# Patient Record
Sex: Female | Born: 2005 | Race: White | Hispanic: No | Marital: Single | Smoking: Never smoker
Health system: Southern US, Community
[De-identification: ages and names within clinical notes are randomized; demographics above are authoritative.]

## PROBLEM LIST (undated history)

## (undated) DIAGNOSIS — J302 Other seasonal allergic rhinitis: Secondary | ICD-10-CM

## (undated) DIAGNOSIS — I951 Orthostatic hypotension: Secondary | ICD-10-CM

## (undated) HISTORY — DX: Orthostatic hypotension: I95.1

## (undated) HISTORY — PX: NO PAST SURGERIES: SHX2092

---
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
## 2023-01-24 ENCOUNTER — Ambulatory Visit (INDEPENDENT_AMBULATORY_CARE_PROVIDER_SITE_OTHER): Payer: Medicaid Other | Admitting: Pediatric Endocrinology

## 2023-01-24 DIAGNOSIS — R5383 Other fatigue: Secondary | ICD-10-CM | POA: Diagnosis not present

## 2023-01-24 DIAGNOSIS — R7989 Other specified abnormal findings of blood chemistry: Secondary | ICD-10-CM | POA: Diagnosis not present

## 2023-01-24 DIAGNOSIS — E250 Congenital adrenogenital disorders associated with enzyme deficiency: Secondary | ICD-10-CM | POA: Diagnosis not present

## 2023-01-24 DIAGNOSIS — F330 Major depressive disorder, recurrent, mild: Secondary | ICD-10-CM | POA: Diagnosis not present

## 2023-01-24 DIAGNOSIS — E781 Pure hyperglyceridemia: Secondary | ICD-10-CM | POA: Diagnosis not present

## 2023-01-24 DIAGNOSIS — R251 Tremor, unspecified: Secondary | ICD-10-CM | POA: Diagnosis not present

## 2023-01-24 DIAGNOSIS — F411 Generalized anxiety disorder: Secondary | ICD-10-CM | POA: Diagnosis not present

## 2023-03-01 DIAGNOSIS — L720 Epidermal cyst: Secondary | ICD-10-CM | POA: Diagnosis not present

## 2023-03-01 DIAGNOSIS — L301 Dyshidrosis [pompholyx]: Secondary | ICD-10-CM | POA: Diagnosis not present

## 2023-03-01 DIAGNOSIS — F411 Generalized anxiety disorder: Secondary | ICD-10-CM | POA: Diagnosis not present

## 2023-03-01 DIAGNOSIS — R251 Tremor, unspecified: Secondary | ICD-10-CM | POA: Diagnosis not present

## 2023-03-01 DIAGNOSIS — F330 Major depressive disorder, recurrent, mild: Secondary | ICD-10-CM | POA: Diagnosis not present

## 2023-03-03 DIAGNOSIS — G250 Essential tremor: Secondary | ICD-10-CM | POA: Diagnosis not present

## 2023-03-27 ENCOUNTER — Ambulatory Visit
Admission: EM | Admit: 2023-03-27 | Discharge: 2023-03-27 | Disposition: A | Discharge status: Home / Self Care | Payer: Medicaid Other

## 2023-03-27 DIAGNOSIS — S91332A Puncture wound without foreign body, left foot, initial encounter: Secondary | ICD-10-CM | POA: Diagnosis not present

## 2023-03-27 MED ORDER — AMOXICILLIN-POT CLAVULANATE 875-125 MG PO TABS: 1.0000 | ORAL_TABLET | Freq: Two times a day (BID) | ORAL | 0 refills | Status: AC

## 2023-03-27 NOTE — Discharge Instructions (Addendum)
Take medication as prescribed. May take over-the-counter Tylenol or ibuprofen as needed for pain, fever, or general discomfort. Apply ice to the left foot as needed for pain or discomfort. Make sure you are wearing comfortable shoes with good insoles while symptoms persist. Monitor the area for worsening.  If you experience redness or swelling that goes up the leg, or into the foot along with fever, chills, increased pain or swelling, please go to the emergency department immediately for further evaluation. Follow-up as needed.

## 2023-03-27 NOTE — ED Triage Notes (Signed)
Pt states she stepped on a nail with her left foot today. Now having pain and slight swelling to bottom of her foot where the nail went in. Pt states her last tetanus shot was in 2016.
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
## 2023-06-02 DIAGNOSIS — E250 Congenital adrenogenital disorders associated with enzyme deficiency: Secondary | ICD-10-CM | POA: Diagnosis not present

## 2023-06-02 DIAGNOSIS — R251 Tremor, unspecified: Secondary | ICD-10-CM | POA: Diagnosis not present

## 2023-06-02 DIAGNOSIS — R7989 Other specified abnormal findings of blood chemistry: Secondary | ICD-10-CM | POA: Diagnosis not present

## 2023-06-02 DIAGNOSIS — R21 Rash and other nonspecific skin eruption: Secondary | ICD-10-CM | POA: Diagnosis not present

## 2023-06-02 DIAGNOSIS — M2550 Pain in unspecified joint: Secondary | ICD-10-CM | POA: Diagnosis not present

## 2023-06-02 DIAGNOSIS — R635 Abnormal weight gain: Secondary | ICD-10-CM | POA: Diagnosis not present

## 2023-06-02 DIAGNOSIS — Z840 Family history of diseases of the skin and subcutaneous tissue: Secondary | ICD-10-CM | POA: Diagnosis not present

## 2023-06-05 DIAGNOSIS — G250 Essential tremor: Secondary | ICD-10-CM | POA: Diagnosis not present

## 2023-07-12 DIAGNOSIS — L853 Xerosis cutis: Secondary | ICD-10-CM | POA: Diagnosis not present

## 2023-07-12 DIAGNOSIS — L301 Dyshidrosis [pompholyx]: Secondary | ICD-10-CM | POA: Diagnosis not present

## 2023-08-24 DIAGNOSIS — R7989 Other specified abnormal findings of blood chemistry: Secondary | ICD-10-CM | POA: Diagnosis not present

## 2023-08-24 DIAGNOSIS — E250 Congenital adrenogenital disorders associated with enzyme deficiency: Secondary | ICD-10-CM | POA: Diagnosis not present

## 2023-08-24 DIAGNOSIS — F411 Generalized anxiety disorder: Secondary | ICD-10-CM | POA: Diagnosis not present

## 2023-08-24 DIAGNOSIS — R251 Tremor, unspecified: Secondary | ICD-10-CM | POA: Diagnosis not present

## 2023-09-22 DIAGNOSIS — E250 Congenital adrenogenital disorders associated with enzyme deficiency: Secondary | ICD-10-CM | POA: Diagnosis not present

## 2023-09-22 DIAGNOSIS — N946 Dysmenorrhea, unspecified: Secondary | ICD-10-CM | POA: Diagnosis not present

## 2023-09-22 DIAGNOSIS — K219 Gastro-esophageal reflux disease without esophagitis: Secondary | ICD-10-CM | POA: Diagnosis not present

## 2023-09-22 DIAGNOSIS — F411 Generalized anxiety disorder: Secondary | ICD-10-CM | POA: Diagnosis not present

## 2023-09-22 DIAGNOSIS — L309 Dermatitis, unspecified: Secondary | ICD-10-CM | POA: Diagnosis not present

## 2023-09-22 DIAGNOSIS — R251 Tremor, unspecified: Secondary | ICD-10-CM | POA: Diagnosis not present

## 2023-09-22 DIAGNOSIS — F330 Major depressive disorder, recurrent, mild: Secondary | ICD-10-CM | POA: Diagnosis not present

## 2023-09-22 DIAGNOSIS — Z91018 Allergy to other foods: Secondary | ICD-10-CM | POA: Diagnosis not present

## 2023-12-15 ENCOUNTER — Encounter: Payer: Self-pay | Admitting: *Deleted

## 2023-12-29 DIAGNOSIS — J302 Other seasonal allergic rhinitis: Secondary | ICD-10-CM | POA: Diagnosis not present

## 2023-12-29 DIAGNOSIS — E250 Congenital adrenogenital disorders associated with enzyme deficiency: Secondary | ICD-10-CM | POA: Diagnosis not present

## 2023-12-29 DIAGNOSIS — F411 Generalized anxiety disorder: Secondary | ICD-10-CM | POA: Diagnosis not present

## 2023-12-29 DIAGNOSIS — F330 Major depressive disorder, recurrent, mild: Secondary | ICD-10-CM | POA: Diagnosis not present

## 2023-12-29 DIAGNOSIS — N946 Dysmenorrhea, unspecified: Secondary | ICD-10-CM | POA: Diagnosis not present

## 2023-12-29 DIAGNOSIS — E66812 Obesity, class 2: Secondary | ICD-10-CM | POA: Diagnosis not present

## 2023-12-29 DIAGNOSIS — Z6835 Body mass index (BMI) 35.0-35.9, adult: Secondary | ICD-10-CM | POA: Diagnosis not present

## 2023-12-29 DIAGNOSIS — Z91018 Allergy to other foods: Secondary | ICD-10-CM | POA: Diagnosis not present

## 2023-12-29 DIAGNOSIS — L309 Dermatitis, unspecified: Secondary | ICD-10-CM | POA: Diagnosis not present

## 2023-12-29 DIAGNOSIS — Z0000 Encounter for general adult medical examination without abnormal findings: Secondary | ICD-10-CM | POA: Diagnosis not present

## 2024-01-22 ENCOUNTER — Ambulatory Visit

## 2024-01-22 ENCOUNTER — Ambulatory Visit
Admission: EM | Admit: 2024-01-22 | Discharge: 2024-01-22 | Disposition: A | Discharge status: Home / Self Care | Attending: Family Medicine | Admitting: Family Medicine

## 2024-01-22 DIAGNOSIS — S9031XA Contusion of right foot, initial encounter: Secondary | ICD-10-CM
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
## 2024-01-22 NOTE — ED Triage Notes (Signed)
 Pt being seen in UC for R foot injury that occurred Thursday night. Pt reports accidentally kicking aquarium. Pt reports 2/10 pain. Pt reports bruising and swelling on top of foot. Pt reports sharp pain when bending toes.

## 2024-01-22 NOTE — Discharge Instructions (Signed)
 Rest, ice,compression, elevation, over-the-counter pain relievers as needed.

## 2024-02-27 DIAGNOSIS — K7581 Nonalcoholic steatohepatitis (NASH): Secondary | ICD-10-CM | POA: Diagnosis not present

## 2024-02-27 DIAGNOSIS — R7989 Other specified abnormal findings of blood chemistry: Secondary | ICD-10-CM | POA: Diagnosis not present

## 2024-03-01 DIAGNOSIS — R160 Hepatomegaly, not elsewhere classified: Secondary | ICD-10-CM | POA: Diagnosis not present

## 2024-03-01 DIAGNOSIS — K7581 Nonalcoholic steatohepatitis (NASH): Secondary | ICD-10-CM | POA: Diagnosis not present
